# Patient Record
Sex: Male | Born: 2000 | Race: White | Hispanic: No | Marital: Single | Smoking: Never smoker
Health system: Southern US, Community
[De-identification: ages and names within clinical notes are randomized; demographics above are authoritative.]

## PROBLEM LIST (undated history)

## (undated) DIAGNOSIS — F419 Anxiety disorder, unspecified: Secondary | ICD-10-CM

## (undated) DIAGNOSIS — H7191 Unspecified cholesteatoma, right ear: Secondary | ICD-10-CM

## (undated) DIAGNOSIS — R6252 Short stature (child): Secondary | ICD-10-CM

## (undated) DIAGNOSIS — F909 Attention-deficit hyperactivity disorder, unspecified type: Secondary | ICD-10-CM

## (undated) DIAGNOSIS — T7840XA Allergy, unspecified, initial encounter: Secondary | ICD-10-CM

## (undated) DIAGNOSIS — K5904 Chronic idiopathic constipation: Secondary | ICD-10-CM

## (undated) HISTORY — PX: OTHER SURGICAL HISTORY: SHX169

## (undated) HISTORY — DX: Anxiety disorder, unspecified: F41.9

## (undated) HISTORY — DX: Chronic idiopathic constipation: K59.04

## (undated) HISTORY — PX: TYMPANOSTOMY TUBE PLACEMENT: SHX32

## (undated) HISTORY — DX: Allergy, unspecified, initial encounter: T78.40XA

## (undated) HISTORY — PX: ADENOIDECTOMY: SUR15

## (undated) HISTORY — PX: CIRCUMCISION: SUR203

## (undated) HISTORY — DX: Short stature (child): R62.52

## (undated) HISTORY — DX: Attention-deficit hyperactivity disorder, unspecified type: F90.9

## (undated) HISTORY — DX: Unspecified cholesteatoma, right ear: H71.91

---
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
## 2018-02-12 DIAGNOSIS — Z23 Encounter for immunization: Secondary | ICD-10-CM | POA: Diagnosis not present

## 2018-02-12 DIAGNOSIS — J309 Allergic rhinitis, unspecified: Secondary | ICD-10-CM | POA: Diagnosis not present

## 2018-05-18 DIAGNOSIS — S0990XA Unspecified injury of head, initial encounter: Secondary | ICD-10-CM | POA: Diagnosis not present

## 2018-05-18 DIAGNOSIS — R200 Anesthesia of skin: Secondary | ICD-10-CM | POA: Diagnosis not present

## 2018-10-05 DIAGNOSIS — R509 Fever, unspecified: Secondary | ICD-10-CM | POA: Diagnosis not present

## 2020-12-18 ENCOUNTER — Encounter (HOSPITAL_BASED_OUTPATIENT_CLINIC_OR_DEPARTMENT_OTHER): Payer: Self-pay

## 2020-12-18 ENCOUNTER — Other Ambulatory Visit: Payer: Self-pay

## 2020-12-18 ENCOUNTER — Emergency Department (HOSPITAL_BASED_OUTPATIENT_CLINIC_OR_DEPARTMENT_OTHER)
Admission: EM | Admit: 2020-12-18 | Discharge: 2020-12-19 | Disposition: A | Discharge status: Home / Self Care | Payer: No Typology Code available for payment source | Attending: Emergency Medicine | Admitting: Emergency Medicine

## 2020-12-18 DIAGNOSIS — S0591XA Unspecified injury of right eye and orbit, initial encounter: Secondary | ICD-10-CM | POA: Insufficient documentation

## 2020-12-18 DIAGNOSIS — W890XXA Exposure to welding light (arc), initial encounter: Secondary | ICD-10-CM | POA: Diagnosis not present

## 2020-12-18 DIAGNOSIS — J45909 Unspecified asthma, uncomplicated: Secondary | ICD-10-CM | POA: Diagnosis not present

## 2020-12-18 DIAGNOSIS — H16133 Photokeratitis, bilateral: Secondary | ICD-10-CM

## 2020-12-18 MED ORDER — TETRACAINE HCL 0.5 % OP SOLN
2.0000 [drp] | Freq: Once | OPHTHALMIC | Status: AC
  Administered 2020-12-18: 2 [drp] via OPHTHALMIC

## 2020-12-18 MED ORDER — TETRACAINE HCL 0.5 % OP SOLN
OPHTHALMIC | Status: AC
  Filled 2020-12-18: qty 4

## 2020-12-19 MED ORDER — HYDROCODONE-ACETAMINOPHEN 5-325 MG PO TABS: 2.0000 | ORAL_TABLET | ORAL | 0 refills | Status: AC | PRN

## 2020-12-19 MED ORDER — HYDROCODONE-ACETAMINOPHEN 5-325 MG PO TABS
2.0000 | ORAL_TABLET | Freq: Once | ORAL | Status: AC
  Administered 2020-12-19: 2 via ORAL
  Filled 2020-12-19: qty 2

## 2020-12-19 NOTE — ED Provider Notes (Signed)
[facility] EMERGENCY DEPT Provider Note   CSN: [id number] Arrival date & time: 12/18/20  2316     History Chief Complaint  Patient presents with   Eye Injury    Matthew Best is a 20 y.o. male.  Patient is a 20 year old male with history of ADHD, anxiety, asthma.  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  This afternoon his eyes began to burn and this significantly worsened into this evening.  He describes blurry vision and pain that is worse with bright lights.  The history is provided by the patient.  Eye Injury This is a new problem. Episode onset: This morning. The problem occurs constantly. The problem has been rapidly worsening. Nothing aggravates the symptoms. Nothing relieves the symptoms.      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   Repaired 05/31/2011 with temporalis fascia graft repair   Constipation - functional    Jaundice, neonatal    Peak bili 14   Short stature     Patient Active Problem List   Diagnosis Date Noted   Encounter for routine child health examination without abnormal findings 05/27/2016   BMI (body mass index), pediatric, 5% to less than 85% for age 99/18/2017   Attention deficit hyperactivity disorder (ADHD), combined type 06/01/2014   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   Osgood-Schlatter's disease of left knee 12/02/2013   Well child check 02/02/2013   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    Past Surgical History:  Procedure Laterality Date   ADENOIDECTOMY     cholesteotoma     CIRCUMCISION     TYMPANOSTOMY TUBE PLACEMENT     3 sets       Family History  Problem Relation Age of Onset   Asthma Mother    Thyroid disease Mother    Migraines Mother    Irritable bowel syndrome Mother    Heart disease Paternal Grandfather    Depression Son    Alcohol abuse Neg Hx     Arthritis Neg Hx    Birth defects Neg Hx    Cancer Neg Hx    COPD Neg Hx    Diabetes Neg Hx    Drug abuse Neg Hx    Early death Neg Hx    Hearing loss Neg Hx    Hyperlipidemia Neg Hx    Hypertension Neg Hx    Kidney disease Neg Hx    Learning disabilities Neg Hx    Mental illness Neg Hx    Mental retardation Neg Hx    Miscarriages / Stillbirths Neg Hx    Stroke Neg Hx    Vision loss Neg Hx     Social History   Tobacco Use   Smoking status: Never   Smokeless tobacco: Never  Substance Use Topics   Alcohol use: No   Drug use: No    Home Medications Prior to Admission medications   Medication Sig Start Date End Date Taking? Authorizing Provider  busPIRone (BUSPAR) 5 MG tablet 5 mg 2 (two) times daily. Takes every morning and PM PRN 11/06/10   [provider]  cefdinir (OMNICEF) 300 MG capsule Take 1 capsule (300 mg total) by mouth 2 (two) times daily. 07/31/17   Georgiann Hahn, MD  docusate sodium (COLACE) 100 MG capsule Take 100 mg by mouth at bedtime.    [provider]  levalbuterol (XOPENEX HFA) 45 MCG/ACT inhaler Inhale 1-2 puffs into the lungs every 4 (four) hours as needed.      [provider]  levocetirizine (XYZAL) 5 MG tablet  09/16/10   [provider]  lisdexamfetamine (VYVANSE) 30 MG capsule Take 1 capsule (30 mg total) by mouth daily with breakfast. 11/18/17 12/18/17  Georgiann Hahn, MD  lisdexamfetamine (VYVANSE) 30 MG capsule Take 1 capsule (30 mg total) by mouth daily with breakfast. 01/18/18 02/17/18  Georgiann Hahn, MD  lisdexamfetamine (VYVANSE) 30 MG capsule Take 1 capsule (30 mg total) by mouth daily with breakfast. 12/18/17 01/17/18  Georgiann Hahn, MD  Melatonin 5 MG CAPS Take 1 capsule by mouth at bedtime.    [provider]  SINGULAIR 5 MG chewable tablet 10 mg at bedtime.  09/16/10   [provider]  Triamcinolone Acetonide (NASACORT ALLERGY 24HR NA) Place into the nose daily.    [provider]    Allergies    Patient has no known allergies.  Review of Systems   Review of Systems  All other systems reviewed and are negative.  Physical Exam Updated Vital Signs BP 117/75   Pulse (!) 54   Temp 98.3 F (36.8 C) (Oral)   Resp 18   Ht 5' 8.5" (1.74 m)   Wt 61.2 kg   SpO2 99%   BMI 20.23 kg/m   Physical Exam Vitals and nursing note reviewed.  Constitutional:      Appearance: Normal appearance.  HENT:     Head: Normocephalic and atraumatic.  Eyes:     Comments: Bilateral conjunctiva are injected.  Corneas appear unremarkable with no obvious abrasions.  Pupils are reactive.  Pulmonary:     Effort: Pulmonary effort is normal.  Neurological:     Mental Status: He is alert.    ED Results / Procedures / Treatments   Labs (all labs ordered are listed, but only abnormal results are displayed) Labs Reviewed - No data to display  EKG None  Radiology No results found.  Procedures Procedures   Medications Ordered in ED Medications  HYDROcodone-acetaminophen (NORCO/VICODIN) 5-325 MG per tablet 2 tablet (has no administration in time range)  tetracaine (PONTOCAINE) 0.5 % ophthalmic solution 2 drop (2 drops Both Eyes Given 12/18/20 2330)    ED Course  I have reviewed the triage vital signs and the nursing notes.  Pertinent labs & imaging results that were available during my care of the patient were reviewed by me and considered in my medical decision making (see chart for details).    MDM Rules/Calculators/A&P  Patient presenting with Welders flash.  He is feeling somewhat better after receiving tetracaine drops.  He will be discharged with pain medication and follow-up as needed.  Final Clinical Impression(s) / ED Diagnoses Final diagnoses:  None    Rx / DC Orders ED Discharge Orders     None        Geoffery Lyons, MD 12/19/20 0206

## 2020-12-19 NOTE — Discharge Instructions (Addendum)
Take ibuprofen 600 mg every 6 hours as needed for pain.  Take hydrocodone as prescribed as needed for pain not relieved with ibuprofen.  Follow-up with primary doctor if not improving in the next 2 days and return to the ER if symptoms significantly worsen or change.

## 2020-12-19 NOTE — ED Notes (Signed)
This RN presented the AVS utilizing Teachback Method. Patient verbalizes understanding of Discharge Instructions. Opportunity for Questioning and Answers were provided. Patient Discharged from ED ambulatory to Home with Father.  

## 2022-09-04 ENCOUNTER — Other Ambulatory Visit (HOSPITAL_COMMUNITY): Payer: Self-pay

## 2022-09-04 MED ORDER — LISDEXAMFETAMINE DIMESYLATE 30 MG PO CAPS
30.0000 mg | ORAL_CAPSULE | Freq: Every morning | ORAL | 0 refills | Status: AC
  Filled 2022-09-04: qty 30, 30d supply, fill #0

## 2022-10-08 ENCOUNTER — Other Ambulatory Visit (HOSPITAL_COMMUNITY): Payer: Self-pay

## 2022-10-10 ENCOUNTER — Other Ambulatory Visit (HOSPITAL_COMMUNITY): Payer: Self-pay

## 2022-10-10 MED ORDER — LISDEXAMFETAMINE DIMESYLATE 30 MG PO CAPS
30.0000 mg | ORAL_CAPSULE | Freq: Every day | ORAL | 0 refills | Status: AC | PRN
  Filled 2022-10-10: qty 30, 30d supply, fill #0

## 2022-10-11 ENCOUNTER — Other Ambulatory Visit (HOSPITAL_COMMUNITY): Payer: Self-pay

## 2022-10-11 MED ORDER — LISDEXAMFETAMINE DIMESYLATE 30 MG PO CAPS: ORAL_CAPSULE | ORAL | 0 refills | Status: AC

## 2022-11-08 ENCOUNTER — Other Ambulatory Visit (HOSPITAL_COMMUNITY): Payer: Self-pay

## 2024-06-24 ENCOUNTER — Emergency Department (HOSPITAL_BASED_OUTPATIENT_CLINIC_OR_DEPARTMENT_OTHER)
Admission: EM | Admit: 2024-06-24 | Discharge: 2024-06-24 | Disposition: A | Discharge status: Home / Self Care | Payer: Worker's Compensation | Attending: Emergency Medicine | Admitting: Emergency Medicine

## 2024-06-24 ENCOUNTER — Other Ambulatory Visit: Payer: Self-pay

## 2024-06-24 ENCOUNTER — Emergency Department (HOSPITAL_BASED_OUTPATIENT_CLINIC_OR_DEPARTMENT_OTHER): Payer: Worker's Compensation

## 2024-06-24 DIAGNOSIS — X58XXXA Exposure to other specified factors, initial encounter: Secondary | ICD-10-CM | POA: Insufficient documentation

## 2024-06-24 DIAGNOSIS — S0502XA Injury of conjunctiva and corneal abrasion without foreign body, left eye, initial encounter: Secondary | ICD-10-CM | POA: Insufficient documentation

## 2024-06-24 DIAGNOSIS — S0501XA Injury of conjunctiva and corneal abrasion without foreign body, right eye, initial encounter: Secondary | ICD-10-CM | POA: Diagnosis present

## 2024-06-24 MED ORDER — TETRACAINE HCL 0.5 % OP SOLN
2.0000 [drp] | Freq: Once | OPHTHALMIC | Status: AC
  Administered 2024-06-24: 2 [drp] via OPHTHALMIC
  Filled 2024-06-24: qty 4

## 2024-06-24 MED ORDER — ERYTHROMYCIN 5 MG/GM OP OINT
TOPICAL_OINTMENT | Freq: Four times a day (QID) | OPHTHALMIC | Status: DC
  Filled 2024-06-24: qty 3.5

## 2024-06-24 MED ORDER — POLYMYXIN B-TRIMETHOPRIM 10000-0.1 UNIT/ML-% OP SOLN
1.0000 [drp] | Freq: Four times a day (QID) | OPHTHALMIC | Status: DC
  Administered 2024-06-24: 1 [drp] via OPHTHALMIC
  Filled 2024-06-24: qty 10

## 2024-06-24 MED ORDER — FLUORESCEIN SODIUM 1 MG OP STRP
1.0000 | ORAL_STRIP | Freq: Once | OPHTHALMIC | Status: AC
  Administered 2024-06-24: 1 via OPHTHALMIC
  Filled 2024-06-24: qty 1

## 2024-06-24 NOTE — ED Triage Notes (Signed)
 Pt arrives with c/o bilateral eye pain after rubber bushing exploded in his face. Friend reports pt did flush his eyes and rubber pieces came out. Pt reports light sensitivity. No facial trauma noted.

## 2024-06-24 NOTE — Discharge Instructions (Signed)
 You were found to have a corneal abrasion in both your right and left eye, which is a scratch to the surface of your eye. This should heal within the next week.   Medications prescribed:   To help treat/prevent an eye infection, you have been prescribed an antibiotic eye drop called Polytrim  (Trimethoprim  and Polymyxin B ). Apply one drop to the affected eye every 6 hours while awake for the next 5 days.   You have also been prescribed an antibiotic eye ointment called as erythromycin .  Please apply a small strip to the lower eyelid every 6 hours while awake for the next 5 days   Home care instructions:  If you wear contact lenses, do not use them until your eye caregiver approves. Do not rub your eye as this can cause more damage.  Follow-up instructions: Please follow-up with the ophthalmologist (Dr. Valdemar) listed tomorrow for further evaluation and management of your symptoms.  You had slightly decreased vision in your left eye today and this needs to be closely monitored.  Return instructions:  Please return to the Emergency Department if you experience worsening symptoms.  Please return immediately if you develop severe pain, pus drainage, new change in vision, or fever. Please return if you have any other emergent concerns.

## 2024-06-24 NOTE — ED Provider Notes (Signed)
 " [place] EMERGENCY DEPARTMENT AT [facility] Provider Note   CSN: [id number] Arrival date & time: 06/24/24  1711     Patient presents with: Eye Problem   Matthew Best is a 24 y.o. male with no significant past medical history presents with concern for eye pain that began earlier today.  Reports that at work, piece of rubber exploded and small pieces of rubber went into his eyes.  He reports he flushed out his eyes before coming into the ER.  Reports pain in both eyes as well as light sensitivity.  Denies any changes in vision.  Reports tetanus was last updated in 2025.  Patient denies any contact lens use, reports he does not wear glasses    Eye Problem Associated symptoms: photophobia and redness        Prior to Admission medications  Medication Sig Start Date End Date Taking? Authorizing Provider  busPIRone (BUSPAR) 5 MG tablet 5 mg 2 (two) times daily. Takes every morning and PM PRN 11/06/10   [provider]  cefdinir  (OMNICEF ) 300 MG capsule Take 1 capsule (300 mg total) by mouth 2 (two) times daily. 07/31/17   Darrol Merck, MD  docusate sodium (COLACE) 100 MG capsule Take 100 mg by mouth at bedtime.    [provider]  HYDROcodone -acetaminophen  (NORCO) 5-325 MG tablet Take 2 tablets by mouth every 4 (four) hours as needed. 12/19/20   Geroldine Berg, MD  levalbuterol [facility] HFA) 45 MCG/ACT inhaler Inhale 1-2 puffs into the lungs every 4 (four) hours as needed.      [provider]  levocetirizine (XYZAL) 5 MG tablet  09/16/10   [provider]  lisdexamfetamine  (VYVANSE ) 30 MG capsule Take 1 capsule (30 mg total) by mouth daily with breakfast. 11/18/17 12/18/17  Darrol Merck, MD  lisdexamfetamine  (VYVANSE ) 30 MG capsule Take 1 capsule (30 mg total) by mouth daily with breakfast. 01/18/18 02/17/18  Darrol Merck, MD  lisdexamfetamine  (VYVANSE ) 30 MG capsule Take 1 capsule (30 mg total) by mouth daily with breakfast. 12/18/17  01/17/18  Darrol Merck, MD  lisdexamfetamine  (VYVANSE ) 30 MG capsule Take 1 capsule (30 mg total) by mouth every morning if needed. 09/04/22     lisdexamfetamine  (VYVANSE ) 30 MG capsule Take 1 capsule (30 mg total) by mouth in the morning as needed. 10/10/22     lisdexamfetamine  (VYVANSE ) 30 MG capsule Take 1 capsule by mouth once daily in the morning if needed - may fill 11/08/22 11/08/22     lisdexamfetamine  (VYVANSE ) 30 MG capsule Take 1 capsule by mouth once daily in the morning if needed - may fill 12/08/22 12/08/22     Melatonin 5 MG CAPS Take 1 capsule by mouth at bedtime.    [provider]  SINGULAIR 5 MG chewable tablet 10 mg at bedtime.  09/16/10   [provider]  Triamcinolone Acetonide (NASACORT ALLERGY 24HR NA) Place into the nose daily.    [provider]    Allergies: Patient has no known allergies.    Review of Systems  Eyes:  Positive for photophobia, pain and redness.    Updated Vital Signs BP 121/78   Pulse 83   Temp 98 F (36.7 C) (Oral)   Resp 18   SpO2 96%   Physical Exam Vitals and nursing note reviewed.  Constitutional:      Appearance: Normal appearance.  HENT:     Head: Atraumatic.  Eyes:     Extraocular Movements: Extraocular movements intact.  Pupils: Pupils are equal, round, and reactive to light.     Comments: Pupils equal round and reactive bilaterally Intact and pain-free EOM bilaterally Visual fields intact bilaterally  Visual acuity 20/20 in the right eye, 20/40 left eye, 20/20 both eyes  Right eye: Eyes watering, but no other abnormal discharge.  Eyelid without erythema or edema.  No proptosis  No obvious foreign body even with eyelid eversion Conjunctiva injected and erythematous  Fluorescein  exam with multiple small corneal abrasions noted over the conjunctiva and cornea  No Sidel sign  IOP 23  Left eye: Eyes watering, but no other abnormal discharge.  Eyelid without erythema or edema.  No  proptosis  No obvious foreign body even with eyelid eversion Conjunctiva injected and erythematous  Fluorescein  exam with multiple small corneal abrasions noted over the conjunctiva and cornea  No Sidel sign  IOP 25   Pulmonary:     Effort: Pulmonary effort is normal.  Neurological:     General: No focal deficit present.     Mental Status: He is alert.  Psychiatric:        Mood and Affect: Mood normal.        Behavior: Behavior normal.     (all labs ordered are listed, but only abnormal results are displayed) Labs Reviewed - No data to display  EKG: None  Radiology: CT Orbits Wo Contrast Result Date: 06/24/2024 EXAM: CT ORBITS WITHOUT CONTRAST 06/24/2024 06:39:39 PM TECHNIQUE: CT of the orbits was performed without the administration of intravenous contrast. Multiplanar reformatted images are provided for review. Automated exposure control, iterative reconstruction, and/or weight based adjustment of the mA/kV was utilized to reduce the radiation dose to as low as reasonably achievable. COMPARISON: None available. CLINICAL HISTORY: rubber hit both eyes, rule out retrobulbar hematoma or retained foreign body FINDINGS: ORBITS: Globes are intact. Ocular lenses are orthotopically positioned. Normal extraocular muscles. Normal optic nerve-sheath complexes. No retro-orbital foreign body, hematoma, or inflammatory change. No mass. SOFT TISSUES: No acute abnormality. SINUSES AND MASTOIDS: Mastoid air cells and middle ear cavities are clear. No retained radiopaque foreign body. No acute abnormality. BONES: No acute abnormality. IMPRESSION: 1. No evidence of retrobulbar hematoma or retained foreign body. Electronically signed by: Dorethia Molt MD 06/24/2024 06:47 PM EST RP Workstation: [id number]     Procedures   Medications Ordered in the ED  trimethoprim -polymyxin b  (POLYTRIM ) ophthalmic solution 1 drop (1 drop Both Eyes Given 06/24/24 1827)  erythromycin  ophthalmic ointment ( Both Eyes  Given 06/24/24 1827)  fluorescein  ophthalmic strip 1 strip (1 strip Both Eyes Given 06/24/24 1738)  tetracaine  (PONTOCAINE) 0.5 % ophthalmic solution 2 drop (2 drops Both Eyes Given by Other 06/24/24 1735)                                    Medical Decision Making Amount and/or Complexity of Data Reviewed Radiology: ordered.  Risk Prescription drug management.     Differential diagnosis includes but is not limited to corneal abrasion, globe rupture, retrobulbar hematoma, retained foreign body  ED Course:  Upon initial evaluation, patient with eyes closed, reporting photosensitivity.  His right and left eye were flushed with normal saline by RN upon arrival, and no obvious foreign body appreciated on my initial exam even with eyelid eversion.  Pupils are equal round and reactive bilaterally.  Intact and pain-free EOM.  Patient with 20/20 visual acuity in the right eye and bilaterally.  Slightly  decreased visual acuity in the left eye at 20/40.  Intraocular pressure was slightly increased at 23 in the right eye, 25 in the left eye.  Given the mechanism of injury with the rubber hitting his eye, question high velocity injury such as retrobulbar hematoma.  CT orbits was obtained which did not show any retrobulbar hematoma or any retained foreign body.  Tetracaine  drops were applied to the eyes, and patient reported improvement in pain with these eyedrops.  Fluorescein  exam was performed, patient with multiple small corneal abrasions across the cornea and conjunctiva diffusely.  No evidence of globe rupture, negative Sidel sign.  Patient given erythromycin  ointment and Polytrim  drops for treatment of corneal abrasion.  Patient's tetanus is up-to-date per patient, states he had a tetanus shot last year.  Discussed case with my attending Dr. Ruthe, he recommends discharge home with close follow-up with ophthalmology, will provide patient with contact information for ophthalmology to follow-up  tomorrow.  Patient stable and appropriate for discharge home this time   Imaging Studies ordered: I ordered imaging studies including CT orbits I independently visualized the imaging with scope of interpretation limited to determining acute life threatening conditions related to emergency care. Imaging showed  IMPRESSION:  1. No evidence of retrobulbar hematoma or retained foreign body.   I agree with the radiologist interpretation  Medications Given: Erythromycin  Polytrim  drops Tetracaine   Impression: Bilateral corneal abrasions  Disposition:  The patient was discharged home with instructions to contact ophthalmology Dr. Valdemar tomorrow morning for follow-up appointment tomorrow.  Use provided Polytrim  drops and erythromycin  ointment for corneal abrasions, every 6 hours for the next 5 days. Return precautions given and patient verbalized understanding.    This chart was dictated using voice recognition software, Dragon. Despite the best efforts of this provider to proofread and correct errors, errors may still occur which can change documentation meaning.       Final diagnoses:  Injury of conjunctiva and corneal abrasion of right eye without foreign body, initial encounter  Injury of conjunctiva and corneal abrasion of left eye w/o FB, initial encounter    ED Discharge Orders     None          Veta Palma, PA-C 06/24/24 1906    Ruthe Cornet, DO 06/24/24 2217  "

## 2024-06-25 ENCOUNTER — Encounter (INDEPENDENT_AMBULATORY_CARE_PROVIDER_SITE_OTHER): Payer: Self-pay | Admitting: Ophthalmology

## 2024-06-25 ENCOUNTER — Ambulatory Visit (INDEPENDENT_AMBULATORY_CARE_PROVIDER_SITE_OTHER): Payer: Worker's Compensation | Admitting: Ophthalmology

## 2024-06-25 DIAGNOSIS — S0591XA Unspecified injury of right eye and orbit, initial encounter: Secondary | ICD-10-CM

## 2024-06-25 DIAGNOSIS — S0592XA Unspecified injury of left eye and orbit, initial encounter: Secondary | ICD-10-CM

## 2024-06-25 DIAGNOSIS — H1830 Unspecified corneal membrane change: Secondary | ICD-10-CM

## 2024-06-25 NOTE — Progress Notes (Signed)
 " [facility] - Clinic Note  06/25/2024   CHIEF COMPLAINT Patient presents for Retina Evaluation  HISTORY OF PRESENT ILLNESS: Matthew Best is a 24 y.o. male who presents to the clinic today for:  HPI     Retina Evaluation   In both eyes.  This started 1.  Duration of 1 day.  Associated Symptoms Distortion, Pain and Trauma.  Context:  distance vision, mid-range vision and near vision.  I, the attending physician,  performed the HPI with the patient and updated documentation appropriately.        Comments   Retina eval per ED pt was at work,a  piece of hot rubber exploded and small pieces of rubber went into his eyes.  He reports he flushed out his eyes before going into the ER pain in both eyes as well as light sensitivity watering and redhe denies any changes in vision other some blurring at times he was given erythromycin  and poly every 6 hours          Last edited by Valdemar Rogue, MD on 07/05/2024  3:10 PM.     Pt states yesterday around 4pm he had hot rubber explode and go into his eyes at work. Went to [employer]. Given ointment and emycin-says it helps but not much relief overall. Just feels like sand in eyelids. 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  Referring physician: Kip Righter, MD [address] [place],  [place] [address]  HISTORICAL INFORMATION:  Selected notes from the MEDICAL RECORD NUMBER Referred by ED for eye injury LEE:  Ocular Hx- PMH-   CURRENT MEDICATIONS: No current outpatient medications on file. (Ophthalmic Drugs)   No current facility-administered medications for this visit. (Ophthalmic Drugs)   Current Outpatient Medications (Other)  Medication Sig   lisdexamfetamine  (VYVANSE ) 30 MG capsule Take 1 capsule (30 mg total) by mouth every morning if needed.   busPIRone (BUSPAR) 5 MG tablet 5 mg 2 (two) times daily. Takes every morning and PM PRN (Patient not taking: Reported on 06/25/2024)   cefdinir   (OMNICEF ) 300 MG capsule Take 1 capsule (300 mg total) by mouth 2 (two) times daily. (Patient not taking: Reported on 06/25/2024)   docusate sodium (COLACE) 100 MG capsule Take 100 mg by mouth at bedtime. (Patient not taking: Reported on 06/25/2024)   HYDROcodone -acetaminophen  (NORCO) 5-325 MG tablet Take 2 tablets by mouth every 4 (four) hours as needed. (Patient not taking: Reported on 06/25/2024)   levalbuterol (XOPENEX HFA) 45 MCG/ACT inhaler Inhale 1-2 puffs into the lungs every 4 (four) hours as needed.   (Patient not taking: Reported on 06/25/2024)   levocetirizine (XYZAL) 5 MG tablet  (Patient not taking: Reported on 06/25/2024)   lisdexamfetamine  (VYVANSE ) 30 MG capsule Take 1 capsule (30 mg total) by mouth daily with breakfast. (Patient not taking: Reported on 06/25/2024)   lisdexamfetamine  (VYVANSE ) 30 MG capsule Take 1 capsule (30 mg total) by mouth daily with breakfast. (Patient not taking: Reported on 06/25/2024)   lisdexamfetamine  (VYVANSE ) 30 MG capsule Take 1 capsule (30 mg total) by mouth daily with breakfast. (Patient not taking: Reported on 06/25/2024)   lisdexamfetamine  (VYVANSE ) 30 MG capsule Take 1 capsule (30 mg total) by mouth in the morning as needed. (Patient not taking: Reported on 06/25/2024)   lisdexamfetamine  (VYVANSE ) 30 MG capsule Take 1 capsule by mouth once daily in the morning if needed - may fill 11/08/22 (Patient not taking: Reported on 06/25/2024)   lisdexamfetamine  (VYVANSE )  30 MG capsule Take 1 capsule by mouth once daily in the morning if needed - may fill 12/08/22 (Patient not taking: Reported on 06/25/2024)   Melatonin 5 MG CAPS Take 1 capsule by mouth at bedtime. (Patient not taking: Reported on 06/25/2024)   SINGULAIR 5 MG chewable tablet 10 mg at bedtime.  (Patient not taking: Reported on 06/25/2024)   Triamcinolone Acetonide (NASACORT ALLERGY 24HR NA) Place into the nose daily. (Patient not taking: Reported on 06/25/2024)   No current facility-administered medications  for this visit. (Other)   REVIEW OF SYSTEMS: ROS   Positive for: Eyes Last edited by Resa Delon ORN, COT on 06/25/2024  1:52 PM.     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 05/31/2011 with temporalis fascia graft repair   Constipation - functional    Jaundice, neonatal    Peak bili 16   Short stature    Past Surgical History:  Procedure Laterality Date   ADENOIDECTOMY     cholesteotoma     CIRCUMCISION     TYMPANOSTOMY TUBE PLACEMENT     3 sets   FAMILY HISTORY Family History  Problem Relation Age of Onset   Asthma Mother    Thyroid disease Mother    Migraines Mother    Irritable bowel syndrome Mother    Heart disease Paternal Grandfather    Depression Son    Alcohol abuse Neg Hx    Arthritis Neg Hx    Birth defects Neg Hx    Cancer Neg Hx    COPD Neg Hx    Diabetes Neg Hx    Drug abuse Neg Hx    Early death Neg Hx    Hearing loss Neg Hx    Hyperlipidemia Neg Hx    Hypertension Neg Hx    Kidney disease Neg Hx    Learning disabilities Neg Hx    Mental illness Neg Hx    Mental retardation Neg Hx    Miscarriages / Stillbirths Neg Hx    Stroke Neg Hx    Vision loss Neg Hx    SOCIAL HISTORY Social History[2]     OPHTHALMIC EXAM:  Base Eye Exam     Visual Acuity (Snellen - Linear)       Right Left   Dist [place] 20/30 +1 20/30 -2   Dist ph [place] 20/20 -2 20/25         Tonometry (Tonopen, 2:00 PM)       Right Left   Pressure 16 17         Pupils       Dark Light Shape React APD   Right 3 2 Round Brisk None   Left 3 2 Round Brisk None         Visual Fields       Left Right    Full Full         Extraocular Movement       Right Left    Full, Ortho Full, Ortho         Neuro/Psych     Oriented x3: Yes   Mood/Affect: Normal         Dilation      Both eyes: 2.5% Phenylephrine @ 2:00 PM           Slit Lamp  and Fundus Exam     Slit Lamp Exam       Right Left   Lids/Lashes mild lid edema and erythema mild lid edema and erythema   Conjunctiva/Sclera trace injection trace injection   Cornea punctate PEE superiorly Trace PEE, mild tear film debris   Anterior Chamber Deep and clear Deep and clear   Iris Round and dilated Round and dilated   Lens Clear Clear   Anterior Vitreous mild syneresis mild syneresis         Fundus Exam       Right Left   Disc Pink and Sharp Pink and Sharp   C/D Ratio 0.3 0.2   Macula Flat, Good foveal reflex, No heme or edema Flat, Good foveal reflex, No heme or edema   Vessels Normal Normal   Periphery Attached, no heme Attached, no heme           IMAGING AND PROCEDURES  Imaging and Procedures for 06/25/2024  OCT, Retina - OU - Both Eyes       Right Eye Quality was good. Central Foveal Thickness: 281. Progression has no prior data. Findings include normal foveal contour, no IRF, no SRF.   Left Eye Quality was good. Central Foveal Thickness: 279. Progression has no prior data. Findings include normal foveal contour, no IRF, no SRF.   Notes *Images captured and stored on drive  Diagnosis / Impression:  NFP, no IRF/SRF  Clinical management:  See below  Abbreviations: NFP - Normal foveal profile. CME - cystoid macular edema. PED - pigment epithelial detachment. IRF - intraretinal fluid. SRF - subretinal fluid. EZ - ellipsoid zone. ERM - epiretinal membrane. ORA - outer retinal atrophy. ORT - outer retinal tubulation. SRHM - subretinal hyper-reflective material. IRHM - intraretinal hyper-reflective material           ASSESSMENT/PLAN:   ICD-10-CM   1. 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 Bilateral Eye Injury  - had hot rubber explode and go into his eyes at work on  01.15.26  - presented to ED where bilateral corneal epi defects were noted -- started on Polytrim  gtts and emycin ung QID OU  - here for ophthalmology f/u  - exam here shows no corneal epi defects -- healed, no just mild PEE  - can d/c Emycin gtts and PSO ung and just use artificial tears 3-4x / day  - f/u here prn  Ophthalmic Meds Ordered this visit:  No orders of the defined types were placed in this encounter.    Return if symptoms worsen or fail to improve.  There are no Patient Instructions on file for this visit.  Explained the diagnoses, plan, and follow up with the patient and they expressed understanding.  Patient expressed understanding of the importance of proper follow up care.   This document serves as a record of services personally performed by Redell JUDITHANN Hans, MD, PhD. It was created on their behalf by Almetta Pesa, an ophthalmic technician. The creation of this record is the provider's dictation and/or activities during the visit.    Electronically signed by: Almetta Pesa, OA, 07/05/24  3:15 PM  Redell JUDITHANN Hans, M.D., Ph.D. Diseases & Surgery of the Retina and Vitreous [facility] 06/25/2024  I have reviewed the above documentation for accuracy and completeness, and I agree with the  above. Redell JUDITHANN Hans, M.D., Ph.D. 07/05/24 3:16 PM   Abbreviations: M myopia (nearsighted); A astigmatism; H hyperopia (farsighted); P presbyopia; Mrx spectacle prescription;  CTL contact lenses; OD right eye; OS left eye; OU both eyes  XT exotropia; ET esotropia; PEK punctate epithelial keratitis; PEE punctate epithelial erosions; DES dry eye syndrome; MGD meibomian gland dysfunction; ATs artificial tears; PFAT's preservative free artificial tears; NSC nuclear sclerotic cataract; PSC posterior subcapsular cataract; ERM epi-retinal membrane; PVD posterior vitreous detachment; RD retinal detachment; DM diabetes mellitus; DR diabetic retinopathy; NPDR  non-proliferative diabetic retinopathy; PDR proliferative diabetic retinopathy; CSME clinically significant macular edema; DME diabetic macular edema; dbh dot blot hemorrhages; CWS cotton wool spot; POAG primary open angle glaucoma; C/D cup-to-disc ratio; HVF humphrey visual field; GVF goldmann visual field; OCT optical coherence tomography; IOP intraocular pressure; BRVO Branch retinal vein occlusion; CRVO central retinal vein occlusion; CRAO central retinal artery occlusion; BRAO branch retinal artery occlusion; RT retinal tear; SB scleral buckle; PPV pars plana vitrectomy; VH Vitreous hemorrhage; PRP panretinal laser photocoagulation; IVK intravitreal kenalog; VMT vitreomacular traction; MH Macular hole;  NVD neovascularization of the disc; NVE neovascularization elsewhere; AREDS age related eye disease study; ARMD age related macular degeneration; POAG primary open angle glaucoma; EBMD epithelial/anterior basement membrane dystrophy; ACIOL anterior chamber intraocular lens; IOL intraocular lens; PCIOL posterior chamber intraocular lens; Phaco/IOL phacoemulsification with intraocular lens placement; PRK photorefractive keratectomy; LASIK laser assisted in situ keratomileusis; HTN hypertension; DM diabetes mellitus; COPD chronic obstructive pulmonary disease      [1] No Known Allergies [2]  Social History Tobacco Use   Smoking status: Never   Smokeless tobacco: Never  Substance Use Topics   Alcohol use: No   Drug use: No   "

## 2024-07-05 ENCOUNTER — Encounter (INDEPENDENT_AMBULATORY_CARE_PROVIDER_SITE_OTHER): Payer: Self-pay | Admitting: Ophthalmology
